# Patient Record
Sex: Female | Born: 1990
Health system: Southern US, Community
[De-identification: ages and names within clinical notes are randomized; demographics above are authoritative.]

## PROBLEM LIST (undated history)

## (undated) DIAGNOSIS — F449 Dissociative and conversion disorder, unspecified: Secondary | ICD-10-CM

## (undated) DIAGNOSIS — F319 Bipolar disorder, unspecified: Secondary | ICD-10-CM

## (undated) DIAGNOSIS — F445 Conversion disorder with seizures or convulsions: Secondary | ICD-10-CM

## (undated) DIAGNOSIS — F4310 Post-traumatic stress disorder, unspecified: Secondary | ICD-10-CM

## (undated) DIAGNOSIS — F32A Depression, unspecified: Secondary | ICD-10-CM

## (undated) DIAGNOSIS — F419 Anxiety disorder, unspecified: Secondary | ICD-10-CM

## (undated) DIAGNOSIS — F329 Major depressive disorder, single episode, unspecified: Secondary | ICD-10-CM

## (undated) HISTORY — DX: Post-traumatic stress disorder, unspecified: F43.10

## (undated) HISTORY — DX: Dissociative and conversion disorder, unspecified: F44.9

## (undated) HISTORY — DX: Bipolar disorder, unspecified: F31.9

## (undated) HISTORY — PX: DENTAL SURGERY: SHX609

---

## 2016-02-07 DIAGNOSIS — F332 Major depressive disorder, recurrent severe without psychotic features: Secondary | ICD-10-CM | POA: Insufficient documentation

## 2016-02-07 DIAGNOSIS — F509 Eating disorder, unspecified: Secondary | ICD-10-CM | POA: Insufficient documentation

## 2016-02-18 DIAGNOSIS — F445 Conversion disorder with seizures or convulsions: Secondary | ICD-10-CM | POA: Insufficient documentation

## 2016-03-14 DIAGNOSIS — F649 Gender identity disorder, unspecified: Secondary | ICD-10-CM | POA: Insufficient documentation

## 2018-01-25 ENCOUNTER — Encounter: Payer: Self-pay | Admitting: Emergency Medicine

## 2018-01-25 ENCOUNTER — Other Ambulatory Visit: Payer: Self-pay

## 2018-01-25 ENCOUNTER — Emergency Department
Admission: EM | Admit: 2018-01-25 | Discharge: 2018-01-25 | Disposition: A | Discharge status: Home / Self Care | Payer: Medicaid Other | Attending: Emergency Medicine | Admitting: Emergency Medicine

## 2018-01-25 DIAGNOSIS — F419 Anxiety disorder, unspecified: Secondary | ICD-10-CM | POA: Diagnosis not present

## 2018-01-25 DIAGNOSIS — F445 Conversion disorder with seizures or convulsions: Secondary | ICD-10-CM | POA: Insufficient documentation

## 2018-01-25 DIAGNOSIS — Z760 Encounter for issue of repeat prescription: Secondary | ICD-10-CM

## 2018-01-25 DIAGNOSIS — F329 Major depressive disorder, single episode, unspecified: Secondary | ICD-10-CM | POA: Diagnosis not present

## 2018-01-25 HISTORY — DX: Conversion disorder with seizures or convulsions: F44.5

## 2018-01-25 HISTORY — DX: Depression, unspecified: F32.A

## 2018-01-25 HISTORY — DX: Anxiety disorder, unspecified: F41.9

## 2018-01-25 HISTORY — DX: Major depressive disorder, single episode, unspecified: F32.9

## 2018-01-25 MED ORDER — BUPROPION HCL 100 MG PO TABS: 50.0000 mg | ORAL_TABLET | Freq: Every day | ORAL | 0 refills | Status: DC

## 2018-01-25 MED ORDER — QUETIAPINE FUMARATE 100 MG PO TABS: 100.0000 mg | ORAL_TABLET | Freq: Every day | ORAL | 0 refills | Status: DC

## 2018-01-25 MED ORDER — SERTRALINE HCL 100 MG PO TABS: 100.0000 mg | ORAL_TABLET | Freq: Every day | ORAL | 0 refills | Status: DC

## 2018-01-25 MED ORDER — TRAZODONE HCL 100 MG PO TABS: 100.0000 mg | ORAL_TABLET | Freq: Every day | ORAL | 0 refills | Status: DC

## 2018-01-25 NOTE — ED Provider Notes (Signed)
[facility] Emergency Department Provider Note ___________________________________________   First MD Initiated Contact with Patient 01/25/18 1731     (approximate)  I have reviewed the triage vital signs and the nursing notes.   HISTORY  Chief Complaint Medication Refill  HPI Diana Meyers is a 27 y.o. female with a history of anxiety, depression and psychogenic seizures was presenting to the emergency department today requesting a medication refill.  The patient says that she has had some small "breakthrough type seizures" ever since being off the medication several weeks ago.  Also reports insomnia.  Says that she is just recently moved from [place] and is trying to establish medical care here in [place].   Past Medical History:  Diagnosis Date  . Anxiety   . Depression   . Psychogenic nonepileptic seizure     There are no active problems to display for this patient.   Past Surgical History:  Procedure Laterality Date  . DENTAL SURGERY      Prior to Admission medications   Not on File    Allergies Shellfish allergy  No family history on file.  Social History Social History   Tobacco Use  . Smoking status: Never Smoker  . Smokeless tobacco: Never Used  Substance Use Topics  . Alcohol use: Not Currently  . Drug use: Yes    Types: Marijuana    Review of Systems  Constitutional: No fever/chills Eyes: No visual changes. ENT: No sore throat. Cardiovascular: Denies chest pain. Respiratory: Denies shortness of breath. Gastrointestinal: No abdominal pain.  No nausea, no vomiting.  No diarrhea.  No constipation. Genitourinary: Negative for dysuria. Musculoskeletal: Negative for back pain. Skin: Negative for rash. Neurological: Negative for headaches, focal weakness or numbness.   ____________________________________________   PHYSICAL EXAM:  VITAL SIGNS: ED Triage Vitals  Enc Vitals Group     BP 01/25/18 1657 (!) 141/91       Pulse Rate 01/25/18 1657 88     Resp 01/25/18 1657 16     Temp 01/25/18 1657 98.2 F (36.8 C)     Temp Source 01/25/18 1657 Oral     SpO2 01/25/18 1657 97 %     Weight 01/25/18 1658 (!) 315 lb (142.9 kg)     Height 01/25/18 1658  (1.905 m)     Head Circumference --      Peak Flow --      Pain Score 01/25/18 1658 4     Pain Loc --      Pain Edu? --      Excl. in GC? --     Constitutional: Alert and oriented. Well appearing and in no acute distress. Eyes: Conjunctivae are normal.  Head: Atraumatic. Nose: No congestion/rhinnorhea. Mouth/Throat: Mucous membranes are moist.  Neck: No stridor.   Cardiovascular: Normal rate, regular rhythm. Grossly normal heart sounds.  Respiratory: Normal respiratory effort.  No retractions. Lungs CTAB. Gastrointestinal: Soft and nontender. No distention.  Musculoskeletal: No lower extremity tenderness nor edema.  No joint effusions. Neurologic:  Normal speech and language. No gross focal neurologic deficits are appreciated.  No seizure activity.  No tremulousness. Skin:  Skin is warm, dry and intact. No rash noted. Psychiatric: Mood and affect are normal. Speech and behavior are normal.  ____________________________________________   LABS (all labs ordered are listed, but only abnormal results are displayed)  Labs Reviewed - No data to display ____________________________________________  EKG   ____________________________________________  RADIOLOGY   ____________________________________________   PROCEDURES  Procedure(s)  performed:   Procedures  Critical Care performed:   ____________________________________________   INITIAL IMPRESSION / ASSESSMENT AND PLAN / ED COURSE  Pertinent labs & imaging results that were available during my care of the patient were reviewed by me and considered in my medical decision making (see chart for details).  DDX: Medication refill, medication withdrawal As part of my medical  decision making, I reviewed the following data within the electronic MEDICAL RECORD NUMBERReviewed notes from previous primary care provider in [place].  Patient's medications to be refilled.  Patient has referral information from [facility] where she was previous to this emergency department. ____________________________________ ________   FINAL CLINICAL IMPRESSION(S) / ED DIAGNOSES  Medication refill.    NEW MEDICATIONS STARTED DURING THIS VISIT:  New Prescriptions   No medications on file     Note:  This document was prepared using Dragon voice recognition software and may include unintentional dictation errors.     Myrna Blazer, MD 01/25/18 [phone]

## 2018-01-25 NOTE — ED Notes (Signed)
Pt refused vitals prior to discharge.

## 2018-01-25 NOTE — ED Triage Notes (Signed)
Patient is out of meds for 3 weeks.  Moved here 1 month ago and does not have a doctor yet.  seroquel 100 mg daily, certaline 100 mg daily, welbutrin 50 mg daily, trazedone 100 mg daily.

## 2018-01-25 NOTE — ED Notes (Signed)
Pt is out of his psych meds.  Moved here and just got insurance.  Walk in would not fill and [facility] told him he needed to see therapist twice before can get.  Pt reports not sleeping much since being off meds.

## 2018-02-22 ENCOUNTER — Ambulatory Visit: Payer: Medicaid Other | Admitting: Family Medicine

## 2018-02-22 ENCOUNTER — Encounter: Payer: Self-pay | Admitting: Family Medicine

## 2018-02-22 VITALS — BP 120/80 | HR 88 | Ht 75.0 in | Wt 355.0 lb

## 2018-02-22 DIAGNOSIS — F331 Major depressive disorder, recurrent, moderate: Secondary | ICD-10-CM

## 2018-02-22 DIAGNOSIS — F5081 Binge eating disorder: Secondary | ICD-10-CM | POA: Diagnosis not present

## 2018-02-22 DIAGNOSIS — Z7689 Persons encountering health services in other specified circumstances: Secondary | ICD-10-CM

## 2018-02-22 DIAGNOSIS — F19982 Other psychoactive substance use, unspecified with psychoactive substance-induced sleep disorder: Secondary | ICD-10-CM

## 2018-02-22 DIAGNOSIS — F419 Anxiety disorder, unspecified: Secondary | ICD-10-CM | POA: Diagnosis not present

## 2018-02-22 DIAGNOSIS — F4310 Post-traumatic stress disorder, unspecified: Secondary | ICD-10-CM | POA: Diagnosis not present

## 2018-02-22 DIAGNOSIS — F649 Gender identity disorder, unspecified: Secondary | ICD-10-CM

## 2018-02-22 DIAGNOSIS — F445 Conversion disorder with seizures or convulsions: Secondary | ICD-10-CM

## 2018-02-22 MED ORDER — SERTRALINE HCL 100 MG PO TABS: 100.0000 mg | ORAL_TABLET | Freq: Every day | ORAL | 1 refills | Status: DC

## 2018-02-22 MED ORDER — QUETIAPINE FUMARATE 100 MG PO TABS: 100.0000 mg | ORAL_TABLET | Freq: Every day | ORAL | 1 refills | Status: DC

## 2018-02-22 MED ORDER — LISDEXAMFETAMINE DIMESYLATE 50 MG PO CAPS: 50.0000 mg | ORAL_CAPSULE | Freq: Every day | ORAL | 0 refills | Status: DC

## 2018-02-22 MED ORDER — SPIRONOLACTONE 50 MG PO TABS: 100.0000 mg | ORAL_TABLET | Freq: Two times a day (BID) | ORAL | 1 refills | Status: DC

## 2018-02-22 MED ORDER — ESTRADIOL 2 MG PO TABS: 4.0000 mg | ORAL_TABLET | Freq: Two times a day (BID) | ORAL | 1 refills | Status: DC

## 2018-02-22 MED ORDER — TRAZODONE HCL 100 MG PO TABS: 100.0000 mg | ORAL_TABLET | Freq: Every day | ORAL | 1 refills | Status: AC

## 2018-02-22 MED ORDER — BUPROPION HCL 100 MG PO TABS: 50.0000 mg | ORAL_TABLET | Freq: Every day | ORAL | 1 refills | Status: DC

## 2018-02-22 NOTE — Progress Notes (Signed)
Name: Diana Meyers   MRN: [id number]    DOB: Nov 26, 1990   Date:02/22/2018       Progress Note  Subjective  Chief Complaint  Chief Complaint  Patient presents with  . Establish Care    new to area  . Psychiatric Evaluation    has upcoming appt first of July with [facility]  . hormone replacement therapy    been out for "almost a year"    Patient is a 27 year old female who presents for a establishment of care. . The patient reports the following problems: anxiety,PTSD,Depression, and psychogenic nonepileptic seizure.[facility] maintenance has been reviewed Reviewed notes from [facility]. Patient has appt with [facility].Need to reinitiate gender affirmation hormone therapy.   No problem-specific Assessment & Plan notes found for this encounter.   Past Medical History:  Diagnosis Date  . Anxiety   . Bipolar 1 disorder (HCC)   . Depression   . Disassociation disorder   . Psychogenic nonepileptic seizure   . PTSD (post-traumatic stress disorder)     Past Surgical History:  Procedure Laterality Date  . DENTAL SURGERY      History reviewed. No pertinent family history.  Social History   Socioeconomic History  . Marital status: Single    Spouse name: Not on file  . Number of children: Not on file  . Years of education: Not on file  . Highest education level: Not on file  Occupational History  . Not on file  Social Needs  . Financial resource strain: Not on file  . Food insecurity:    Worry: Not on file    Inability: Not on file  . Transportation needs:    Medical: Not on file    Non-medical: Not on file  Tobacco Use  . Smoking status: Never Smoker  . Smokeless tobacco: Never Used  Substance and Sexual Activity  . Alcohol use: Not Currently  . Drug use: Yes    Types: Marijuana  . Sexual activity: Not on file  Lifestyle  . Physical activity:    Days per week: Not on file    Minutes per session: Not on file  . Stress: Not on file  Relationships  . Social connections:    Talks on  phone: Not on file    Gets together: Not on file    Attends religious service: Not on file    Active member of club or organization: Not on file    Attends meetings of clubs or organizations: Not on file    Relationship status: Not on file  . Intimate partner violence:    Fear of current or ex partner: Not on file    Emotionally abused: Not on file    Physically abused: Not on file    Forced sexual activity: Not on file  Other Topics Concern  . Not on file  Social History Narrative  . Not on file    Allergies  Allergen Reactions  . Shellfish Allergy Other (See Comments)    Genetic disorder.  Also seafood allergy    Outpatient Medications Prior to Visit  Medication Sig Dispense Refill  . buPROPion (WELLBUTRIN) 100 MG tablet Take 0.5 tablets (50 mg total) by mouth daily. 30 tablet 0  . estradiol (ESTRACE) 2 MG tablet Take 2 tablets by mouth 2 (two) times daily.    Marland Kitchen lisdexamfetamine (VYVANSE) 50 MG capsule Take 50 mg by mouth daily.    . QUEtiapine (SEROQUEL) 100 MG tablet Take 1 tablet (100 mg total) by  mouth at bedtime. 30 tablet 0  . sertraline (ZOLOFT) 100 MG tablet Take 1 tablet (100 mg total) by mouth daily. 30 tablet 0  . spironolactone (ALDACTONE) 50 MG tablet Take 2 tablets by mouth 2 (two) times daily.    . traZODone (DESYREL) 100 MG tablet Take 1 tablet (100 mg total) by mouth at bedtime. 30 tablet 0   No facility-administered medications prior to visit.     Review of Systems  Constitutional: Negative for chills, fever, malaise/fatigue and weight loss.  HENT: Negative for ear discharge, ear pain and sore throat.   Eyes: Negative for blurred vision.  Respiratory: Negative for cough, sputum production, shortness of breath and wheezing.   Cardiovascular: Negative for chest pain, palpitations and leg swelling.  Gastrointestinal: Negative for abdominal pain, blood in stool, constipation, diarrhea, heartburn, melena and nausea.  Genitourinary: Negative for dysuria,  frequency, hematuria and urgency.  Musculoskeletal: Negative for back pain, joint pain, myalgias and neck pain.  Skin: Negative for rash.  Neurological: Negative for dizziness, tingling, sensory change, focal weakness and headaches.  Endo/Heme/Allergies: Negative for environmental allergies and polydipsia. Does not bruise/bleed easily.  Psychiatric/Behavioral: Negative for depression and suicidal ideas. The patient is not nervous/anxious and does not have insomnia.      Objective  Vitals:   02/22/18 1446  BP: 120/80  Pulse: 88  Weight: (!) 355 lb (161 kg)  Height: 6\' 3"  (1.905 m)    Physical Exam  Constitutional: She is oriented to person, place, and time. She appears well-developed and well-nourished.  HENT:  Head: Normocephalic.  Right Ear: External ear normal.  Left Ear: External ear normal.  Mouth/Throat: Oropharynx is clear and moist.  Eyes: Pupils are equal, round, and reactive to light. Conjunctivae and EOM are normal. Lids are everted and swept, no foreign bodies found. Left eye exhibits no hordeolum. No foreign body present in the left eye. Right conjunctiva is not injected. Left conjunctiva is not injected. No scleral icterus.  Neck: Normal range of motion. Neck supple. No JVD present. No tracheal deviation present. No thyromegaly present.  Cardiovascular: Normal rate, regular rhythm, normal heart sounds and intact distal pulses. Exam reveals no gallop and no friction rub.  No murmur heard. Pulmonary/Chest: Effort normal and breath sounds normal. No respiratory distress. She has no wheezes. She has no rales.  Abdominal: Soft. Bowel sounds are normal. She exhibits no mass. There is no hepatosplenomegaly. There is no tenderness. There is no rebound and no guarding.  Musculoskeletal: Normal range of motion. She exhibits no edema or tenderness.  Lymphadenopathy:    She has no cervical adenopathy.  Neurological: She is alert and oriented to person, place, and time. She has  normal strength. She displays normal reflexes. No cranial nerve deficit.  Skin: Skin is warm. No rash noted.  Psychiatric: She has a normal mood and affect. Her mood appears not anxious. She does not exhibit a depressed mood.  Nursing note and vitals reviewed.     Assessment & Plan  Problem List Items Addressed This Visit      Other   Eating disorder   Relevant Medications   lisdexamfetamine (VYVANSE) 50 MG capsule   Gender dysphoria   Relevant Medications   spironolactone (ALDACTONE) 50 MG tablet   estradiol (ESTRACE) 2 MG tablet   Psychogenic nonepileptic seizure   Drug-induced insomnia (HCC)   Relevant Medications   traZODone (DESYREL) 100 MG tablet   QUEtiapine (SEROQUEL) 100 MG tablet   PTSD (post-traumatic stress disorder)  Relevant Medications   traZODone (DESYREL) 100 MG tablet   sertraline (ZOLOFT) 100 MG tablet   buPROPion (WELLBUTRIN) 100 MG tablet   Anxiety   Relevant Medications   traZODone (DESYREL) 100 MG tablet   sertraline (ZOLOFT) 100 MG tablet   buPROPion (WELLBUTRIN) 100 MG tablet    Other Visit Diagnoses    Establishing care with new doctor, encounter for    -  Primary   Moderate episode of recurrent major depressive disorder (HCC)       scheduled to see psychiatry in July- evaluate and treat   Relevant Medications   traZODone (DESYREL) 100 MG tablet   sertraline (ZOLOFT) 100 MG tablet   buPROPion (WELLBUTRIN) 100 MG tablet      Meds ordered this encounter  Medications  . traZODone (DESYREL) 100 MG tablet    Sig: Take 1 tablet (100 mg total) by mouth at bedtime.    Dispense:  30 tablet    Refill:  1  . sertraline (ZOLOFT) 100 MG tablet    Sig: Take 1 tablet (100 mg total) by mouth daily.    Dispense:  30 tablet    Refill:  1  . QUEtiapine (SEROQUEL) 100 MG tablet    Sig: Take 1 tablet (100 mg total) by mouth at bedtime.    Dispense:  30 tablet    Refill:  1  . spironolactone (ALDACTONE) 50 MG tablet    Sig: Take 2 tablets (100 mg  total) by mouth 2 (two) times daily.    Dispense:  120 tablet    Refill:  1  . estradiol (ESTRACE) 2 MG tablet    Sig: Take 2 tablets (4 mg total) by mouth 2 (two) times daily.    Dispense:  120 tablet    Refill:  1  . lisdexamfetamine (VYVANSE) 50 MG capsule    Sig: Take 1 capsule (50 mg total) by mouth daily.    Dispense:  30 capsule    Refill:  0  . buPROPion (WELLBUTRIN) 100 MG tablet    Sig: Take 0.5 tablets (50 mg total) by mouth daily.    Dispense:  30 tablet    Refill:  1      Dr. Hayden Rasmusseneanna Anni Hocevar [facility]  02/22/18

## 2018-04-05 ENCOUNTER — Ambulatory Visit: Payer: Medicaid Other | Admitting: Family Medicine

## 2018-04-22 ENCOUNTER — Ambulatory Visit (INDEPENDENT_AMBULATORY_CARE_PROVIDER_SITE_OTHER): Payer: Medicare Other | Admitting: Family Medicine

## 2018-04-22 ENCOUNTER — Encounter: Payer: Self-pay | Admitting: Family Medicine

## 2018-04-22 VITALS — BP 102/70 | HR 64 | Ht 75.0 in | Wt 346.0 lb

## 2018-04-22 DIAGNOSIS — F649 Gender identity disorder, unspecified: Secondary | ICD-10-CM

## 2018-04-22 MED ORDER — ESTRADIOL 2 MG PO TABS: 2.0000 mg | ORAL_TABLET | Freq: Three times a day (TID) | ORAL | 5 refills | Status: DC

## 2018-04-22 MED ORDER — SPIRONOLACTONE 50 MG PO TABS: 100.0000 mg | ORAL_TABLET | Freq: Two times a day (BID) | ORAL | 5 refills | Status: DC

## 2018-04-22 NOTE — Progress Notes (Signed)
Name: Diana Meyers   MRN: [id number]    DOB: 02/19/1991   Date:04/22/2018       Progress Note  Subjective  Chief Complaint  Chief Complaint  Patient presents with  . Follow-up    psychiatrist won't prescribe vyvanse and seroquel/ increased trazodone- not helping with sleep.     Patient follow up for cross hormone therapy. Presently not taking spironolactone and estradiol.   No problem-specific Assessment & Plan notes found for this encounter.   Past Medical History:  Diagnosis Date  . Anxiety   . Bipolar 1 disorder (HCC)   . Depression   . Disassociation disorder   . Psychogenic nonepileptic seizure   . PTSD (post-traumatic stress disorder)     Past Surgical History:  Procedure Laterality Date  . DENTAL SURGERY      History reviewed. No pertinent family history.  Social History   Socioeconomic History  . Marital status: Single    Spouse name: Not on file  . Number of children: Not on file  . Years of education: Not on file  . Highest education level: Not on file  Occupational History  . Not on file  Social Needs  . Financial resource strain: Not on file  . Food insecurity:    Worry: Not on file    Inability: Not on file  . Transportation needs:    Medical: Not on file    Non-medical: Not on file  Tobacco Use  . Smoking status: Never Smoker  . Smokeless tobacco: Never Used  Substance and Sexual Activity  . Alcohol use: Not Currently  . Drug use: Yes    Types: Marijuana  . Sexual activity: Not on file  Lifestyle  . Physical activity:    Days per week: Not on file    Minutes per session: Not on file  . Stress: Not on file  Relationships  . Social connections:    Talks on phone: Not on file    Gets together: Not on file    Attends religious service: Not on file    Active member of club or organization: Not on file    Attends meetings of clubs or organizations: Not on file    Relationship status: Not on file  . Intimate partner violence:    Fear of  current or ex partner: Not on file    Emotionally abused: Not on file    Physically abused: Not on file    Forced sexual activity: Not on file  Other Topics Concern  . Not on file  Social History Narrative  . Not on file    Allergies  Allergen Reactions  . Shellfish Allergy Other (See Comments)    Genetic disorder.  Also seafood allergy    Outpatient Medications Prior to Visit  Medication Sig Dispense Refill  . buPROPion (WELLBUTRIN) 100 MG tablet Take 0.5 tablets (50 mg total) by mouth daily. 30 tablet 1  . lisdexamfetamine (VYVANSE) 50 MG capsule Take 1 capsule (50 mg total) by mouth daily. 30 capsule 0  . QUEtiapine (SEROQUEL) 100 MG tablet Take 1 tablet (100 mg total) by mouth at bedtime. 30 tablet 1  . sertraline (ZOLOFT) 100 MG tablet Take 1 tablet (100 mg total) by mouth daily. 30 tablet 1  . traZODone (DESYREL) 100 MG tablet Take 1 tablet (100 mg total) by mouth at bedtime. (Patient taking differently: Take 200 mg by mouth at bedtime. ) 30 tablet 1  . estradiol (ESTRACE) 2 MG tablet Take 2 tablets (4  mg total) by mouth 2 (two) times daily. 120 tablet 1  . spironolactone (ALDACTONE) 50 MG tablet Take 2 tablets (100 mg total) by mouth 2 (two) times daily. 120 tablet 1   No facility-administered medications prior to visit.     Review of Systems  Constitutional: Negative for chills, fever, malaise/fatigue and weight loss.  HENT: Negative for ear discharge, ear pain and sore throat.   Eyes: Negative for blurred vision.  Respiratory: Negative for cough, sputum production, shortness of breath and wheezing.   Cardiovascular: Negative for chest pain, palpitations and leg swelling.  Gastrointestinal: Negative for abdominal pain, blood in stool, constipation, diarrhea, heartburn, melena and nausea.  Genitourinary: Negative for dysuria, frequency, hematuria and urgency.  Musculoskeletal: Negative for back pain, joint pain, myalgias and neck pain.  Skin: Negative for rash.   Neurological: Negative for dizziness, tingling, sensory change, focal weakness and headaches.  Endo/Heme/Allergies: Negative for environmental allergies and polydipsia. Does not bruise/bleed easily.  Psychiatric/Behavioral: Negative for depression and suicidal ideas. The patient is not nervous/anxious and does not have insomnia.      Objective  Vitals:   04/22/18 1425  BP: 102/70  Pulse: 64  Weight: (!) 346 lb (156.9 kg)  Height: 6\' 3"  (1.905 m)    Physical Exam  Constitutional: She is oriented to person, place, and time. She appears well-developed and well-nourished. No distress.  HENT:  Head: Normocephalic and atraumatic.  Right Ear: External ear normal.  Left Ear: External ear normal.  Nose: Nose normal.  Mouth/Throat: Oropharynx is clear and moist.  Eyes: Pupils are equal, round, and reactive to light. Conjunctivae and EOM are normal. Lids are everted and swept, no foreign bodies found. Right eye exhibits no discharge. Left eye exhibits no discharge and no hordeolum. No foreign body present in the left eye. Right conjunctiva is not injected. Left conjunctiva is not injected. No scleral icterus.  Neck: Normal range of motion. Neck supple. No JVD present. No tracheal deviation present. No thyromegaly present.  Cardiovascular: Normal rate, regular rhythm, normal heart sounds and intact distal pulses. Exam reveals no gallop and no friction rub.  No murmur heard. Pulmonary/Chest: Effort normal and breath sounds normal. No respiratory distress. She has no wheezes. She has no rales.  Abdominal: Soft. Bowel sounds are normal. She exhibits no mass. There is no hepatosplenomegaly. There is no tenderness. There is no rebound and no guarding.  Musculoskeletal: Normal range of motion. She exhibits no edema or tenderness.  Lymphadenopathy:    She has no cervical adenopathy.  Neurological: She is alert and oriented to person, place, and time. She has normal strength and normal reflexes. She  displays normal reflexes. No cranial nerve deficit.  Skin: Skin is warm and dry. No rash noted. She is not diaphoretic.  Psychiatric: She has a normal mood and affect. Her mood appears not anxious. She does not exhibit a depressed mood.      Assessment & Plan  Problem List Items Addressed This Visit      Other   Gender dysphoria - Primary   Relevant Medications   spironolactone (ALDACTONE) 50 MG tablet   estradiol (ESTRACE) 2 MG tablet      Meds ordered this encounter  Medications  . spironolactone (ALDACTONE) 50 MG tablet    Sig: Take 2 tablets (100 mg total) by mouth 2 (two) times daily.    Dispense:  120 tablet    Refill:  5  . estradiol (ESTRACE) 2 MG tablet    Sig: Take  1 tablet (2 mg total) by mouth 3 (three) times daily.    Dispense:  90 tablet    Refill:  5      Dr. Elizabeth Sauer [facility]  04/22/18

## 2018-04-23 ENCOUNTER — Telehealth: Payer: Self-pay

## 2018-04-23 NOTE — Telephone Encounter (Signed)
Diana Meyers returned call from yesterday. The patient has d/c'd her seroquel "on her own" and has "never brought up Vyvanse to Dr. Suzie PortelaMoffitt" they didn't know she was taking Vyvanse. Trazodone increased to 200mg  a day, but they didn't know she was taking Vyvanse and still on Seroquel. I tried to call pt to tell her to give Dr Rhunette CroftMoffitt's office a call and there was no answer or no way to leave a message. Diana Meyers said she can feel free to call their office with any medication concerns

## 2018-04-24 ENCOUNTER — Other Ambulatory Visit: Payer: Self-pay | Admitting: Family Medicine

## 2018-04-24 DIAGNOSIS — F19982 Other psychoactive substance use, unspecified with psychoactive substance-induced sleep disorder: Secondary | ICD-10-CM

## 2018-04-27 DIAGNOSIS — F331 Major depressive disorder, recurrent, moderate: Secondary | ICD-10-CM | POA: Diagnosis not present

## 2018-05-25 DIAGNOSIS — F331 Major depressive disorder, recurrent, moderate: Secondary | ICD-10-CM | POA: Diagnosis not present

## 2018-08-30 DIAGNOSIS — F331 Major depressive disorder, recurrent, moderate: Secondary | ICD-10-CM | POA: Diagnosis not present

## 2018-09-28 DIAGNOSIS — F332 Major depressive disorder, recurrent severe without psychotic features: Secondary | ICD-10-CM | POA: Diagnosis not present

## 2018-10-25 ENCOUNTER — Ambulatory Visit (INDEPENDENT_AMBULATORY_CARE_PROVIDER_SITE_OTHER): Payer: Medicare Other | Admitting: Family Medicine

## 2018-10-25 ENCOUNTER — Encounter: Payer: Self-pay | Admitting: Family Medicine

## 2018-10-25 VITALS — BP 120/68 | HR 80 | Ht 75.0 in | Wt 371.0 lb

## 2018-10-25 DIAGNOSIS — Z6841 Body Mass Index (BMI) 40.0 and over, adult: Secondary | ICD-10-CM | POA: Diagnosis not present

## 2018-10-25 DIAGNOSIS — F649 Gender identity disorder, unspecified: Secondary | ICD-10-CM

## 2018-10-25 MED ORDER — ESTRADIOL 2 MG PO TABS: 2.0000 mg | ORAL_TABLET | Freq: Three times a day (TID) | ORAL | 6 refills | Status: DC

## 2018-10-25 MED ORDER — SPIRONOLACTONE 50 MG PO TABS: 100.0000 mg | ORAL_TABLET | Freq: Two times a day (BID) | ORAL | 6 refills | Status: DC

## 2018-10-25 NOTE — Progress Notes (Signed)
Date:  10/25/2018   Name:  Diana Meyers   DOB:  [birth year]   MRN:  [id number]   Chief Complaint: hormone replacement therapy (refill estradiol and spironolactone/ PHQ9=19- sees Dr Suzie Portela)  Patient is a 28 year old female who presents for a gender affirmation hormone therapy/ exam. The patient reports the following problems: no issue. Health maintenance has been reviewed up to date.   Review of Systems  Constitutional: Negative.  Negative for chills, fatigue, fever and unexpected weight change.  HENT: Negative for congestion, ear discharge, ear pain, rhinorrhea, sinus pressure, sneezing and sore throat.   Eyes: Negative for photophobia, pain, discharge, redness and itching.  Respiratory: Negative for cough, shortness of breath, wheezing and stridor.   Gastrointestinal: Negative for abdominal pain, blood in stool, constipation, diarrhea, nausea and vomiting.  Endocrine: Negative for cold intolerance, heat intolerance, polydipsia, polyphagia and polyuria.  Genitourinary: Negative for dysuria, flank pain, frequency, hematuria, menstrual problem, pelvic pain, urgency, vaginal bleeding and vaginal discharge.  Musculoskeletal: Negative for arthralgias, back pain and myalgias.  Skin: Negative for rash.  Allergic/Immunologic: Negative for environmental allergies and food allergies.  Neurological: Negative for dizziness, weakness, light-headedness, numbness and headaches.  Hematological: Negative for adenopathy. Does not bruise/bleed easily.  Psychiatric/Behavioral: Negative for dysphoric mood. The patient is not nervous/anxious.     Patient Active Problem List   Diagnosis Date Noted  . Drug-induced insomnia (HCC) 02/22/2018  . PTSD (post-traumatic stress disorder) 02/22/2018  . Anxiety 02/22/2018  . Gender dysphoria 03/14/2016  . Psychogenic nonepileptic seizure 02/18/2016  . Eating disorder 02/07/2016  . Severe episode of recurrent major depressive disorder, without psychotic features (HCC)  02/07/2016    Allergies  Allergen Reactions  . Shellfish Allergy Other (See Comments)    Genetic disorder.  Also seafood allergy    Past Surgical History:  Procedure Laterality Date  . DENTAL SURGERY      Social History   Tobacco Use  . Smoking status: Never Smoker  . Smokeless tobacco: Never Used  Substance Use Topics  . Alcohol use: Not Currently  . Drug use: Yes    Types: Marijuana     Medication list has been reviewed and updated.  Current Meds  Medication Sig  . buPROPion (WELLBUTRIN) 100 MG tablet Take 0.5 tablets (50 mg total) by mouth daily. (Patient taking differently: Take 100 mg by mouth daily. psych)  . estradiol (ESTRACE) 2 MG tablet Take 1 tablet (2 mg total) by mouth 3 (three) times daily.  . sertraline (ZOLOFT) 100 MG tablet Take 1 tablet (100 mg total) by mouth daily. (Patient taking differently: Take 100 mg by mouth daily. psych)  . spironolactone (ALDACTONE) 50 MG tablet Take 2 tablets (100 mg total) by mouth 2 (two) times daily.  . traZODone (DESYREL) 100 MG tablet Take 1 tablet (100 mg total) by mouth at bedtime. (Patient taking differently: Take 200 mg by mouth at bedtime. psych)  . zolpidem (AMBIEN) 10 MG tablet Take 10 mg by mouth at bedtime. psych    PHQ 2/9 Scores 10/25/2018 02/22/2018  PHQ - 2 Score 6 5  PHQ- 9 Score 19 19    Physical Exam Vitals signs and nursing note reviewed.  Constitutional:      General: She is not in acute distress.    Appearance: Normal appearance. She is not diaphoretic.  HENT:     Head: Normocephalic and atraumatic.     Right Ear: External ear normal.     Left Ear: External ear normal.  Nose: Nose normal.  Eyes:     General:        Right eye: No discharge.        Left eye: No discharge.     Conjunctiva/sclera: Conjunctivae normal.     Pupils: Pupils are equal, round, and reactive to light.  Neck:     Musculoskeletal: Normal range of motion and neck supple. No muscular tenderness.     Thyroid: No  thyromegaly.     Vascular: No carotid bruit or JVD.  Cardiovascular:     Rate and Rhythm: Normal rate and regular rhythm.     Heart sounds: Normal heart sounds. No murmur. No friction rub. No gallop.   Pulmonary:     Effort: Pulmonary effort is normal.     Breath sounds: Normal breath sounds. No wheezing or rhonchi.  Abdominal:     General: Bowel sounds are normal.     Palpations: Abdomen is soft. There is no mass.     Tenderness: There is no abdominal tenderness. There is no guarding.  Musculoskeletal: Normal range of motion.  Lymphadenopathy:     Cervical: No cervical adenopathy.  Skin:    General: Skin is warm and dry.     Capillary Refill: Capillary refill takes less than 2 seconds.  Neurological:     Mental Status: She is alert.     Deep Tendon Reflexes: Reflexes are normal and symmetric.     BP 120/68   Pulse 80   Ht 6\' 3"  (1.905 m)   Wt (!) 371 lb (168.3 kg)   BMI 46.37 kg/m   Assessment and Plan: 1. BMI 45.0-49.9, adult [facility]) Patient apparently has a diagnosis of an eating disorder for which somebody started her on Vyvanse I have told the patient that I do not prescribe this medication it is control and apparently neither does her psychiatrist and who would prescribe it if it was ADHD but does not prescribe for binge eating disorder.  Patient has been noted to have weight gain I really think it is a matter of her having the ability to say no,  than to depend on the control medicine for the rest of her life to curb her eating.  Probably benefit from some behavioral medicine at in nutrition clinic I will discuss this with my nurse and we will see if we can get her into a situation as such.  2. Gender dysphoria Gender affirmation hormone therapy is to continue with estradiol 6 mg daily and Spironolactone 200 mg daily for testosterone suppression. - estradiol (ESTRACE) 2 MG tablet; Take 1 tablet (2 mg total) by mouth 3 (three) times daily.  Dispense: 90 tablet; Refill: 6 -  spironolactone (ALDACTONE) 50 MG tablet; Take 2 tablets (100 mg total) by mouth 2 (two) times daily.  Dispense: 120 tablet; Refill: 6

## 2018-11-22 DIAGNOSIS — F332 Major depressive disorder, recurrent severe without psychotic features: Secondary | ICD-10-CM | POA: Diagnosis not present

## 2019-03-29 ENCOUNTER — Other Ambulatory Visit: Payer: Self-pay | Admitting: Family Medicine

## 2019-04-04 DIAGNOSIS — F332 Major depressive disorder, recurrent severe without psychotic features: Secondary | ICD-10-CM | POA: Diagnosis not present

## 2019-04-21 ENCOUNTER — Other Ambulatory Visit: Payer: Self-pay

## 2019-04-21 ENCOUNTER — Telehealth: Payer: Self-pay

## 2019-04-21 ENCOUNTER — Ambulatory Visit (INDEPENDENT_AMBULATORY_CARE_PROVIDER_SITE_OTHER): Payer: Medicare Other | Admitting: Family Medicine

## 2019-04-21 ENCOUNTER — Encounter: Payer: Self-pay | Admitting: Family Medicine

## 2019-04-21 VITALS — BP 144/82 | HR 109 | Ht 75.0 in | Wt 381.0 lb

## 2019-04-21 DIAGNOSIS — R030 Elevated blood-pressure reading, without diagnosis of hypertension: Secondary | ICD-10-CM

## 2019-04-21 DIAGNOSIS — R69 Illness, unspecified: Secondary | ICD-10-CM

## 2019-04-21 DIAGNOSIS — F649 Gender identity disorder, unspecified: Secondary | ICD-10-CM

## 2019-04-21 MED ORDER — SPIRONOLACTONE 50 MG PO TABS: 100.0000 mg | ORAL_TABLET | Freq: Two times a day (BID) | ORAL | 6 refills | Status: AC

## 2019-04-21 MED ORDER — ESTRADIOL 2 MG PO TABS: 2.0000 mg | ORAL_TABLET | Freq: Three times a day (TID) | ORAL | 6 refills | Status: AC

## 2019-04-21 NOTE — Patient Instructions (Signed)

## 2019-04-21 NOTE — Progress Notes (Signed)
Date:  04/21/2019   Name:  Diana Meyers   DOB:  1990/10/04   MRN:  [id number]   Chief Complaint: hrt (PHQ9=14- sees Dr Sabino Gasser)  Patient is a 28 year old female who presents for gender affirmation hormone therapy exam. The patient reports the following problems: anxiety. Health maintenance has been reviewed up to date.   Review of Systems  Constitutional: Negative.  Negative for chills, fatigue, fever and unexpected weight change.  HENT: Negative for congestion, ear discharge, ear pain, rhinorrhea, sinus pressure, sneezing and sore throat.   Eyes: Negative for photophobia, pain, discharge, redness and itching.  Respiratory: Negative for cough, choking, chest tightness, shortness of breath, wheezing and stridor.   Cardiovascular: Negative for chest pain, palpitations and leg swelling.  Gastrointestinal: Negative for abdominal pain, blood in stool, constipation, diarrhea, nausea and vomiting.  Endocrine: Negative for cold intolerance, heat intolerance, polydipsia, polyphagia and polyuria.  Genitourinary: Negative for dysuria, flank pain, frequency, hematuria, menstrual problem, pelvic pain, urgency, vaginal bleeding and vaginal discharge.  Musculoskeletal: Negative for arthralgias, back pain and myalgias.  Skin: Negative for rash.  Allergic/Immunologic: Negative for environmental allergies and food allergies.  Neurological: Negative for dizziness, weakness, light-headedness, numbness and headaches.  Hematological: Negative for adenopathy. Does not bruise/bleed easily.  Psychiatric/Behavioral: Negative for dysphoric mood. The patient is not nervous/anxious.     Patient Active Problem List   Diagnosis Date Noted   Drug-induced insomnia ([place]) 02/22/2018   PTSD (post-traumatic stress disorder) 02/22/2018   Anxiety 02/22/2018   Gender dysphoria 03/14/2016   Psychogenic nonepileptic seizure 02/18/2016   Eating disorder 02/07/2016   Severe episode of recurrent major depressive disorder,  without psychotic features ([place]) 02/07/2016    Allergies  Allergen Reactions   Shellfish Allergy Other (See Comments)    Genetic disorder.  Also seafood allergy    Past Surgical History:  Procedure Laterality Date   DENTAL SURGERY      Social History   Tobacco Use   Smoking status: Never Smoker   Smokeless tobacco: Never Used  Substance Use Topics   Alcohol use: Not Currently   Drug use: Yes    Types: Marijuana     Medication list has been reviewed and updated.  Current Meds  Medication Sig   DULoxetine (CYMBALTA) 60 MG capsule Take 60 mg by mouth daily. psych   estradiol (ESTRACE) 2 MG tablet Take 1 tablet (2 mg total) by mouth 3 (three) times daily.   spironolactone (ALDACTONE) 50 MG tablet Take 2 tablets (100 mg total) by mouth 2 (two) times daily.   traZODone (DESYREL) 100 MG tablet Take 1 tablet (100 mg total) by mouth at bedtime. (Patient taking differently: Take 200 mg by mouth at bedtime. psych)   zolpidem (AMBIEN) 10 MG tablet Take 10 mg by mouth at bedtime. psych    PHQ 2/9 Scores 04/21/2019 10/25/2018 02/22/2018  PHQ - 2 Score 5 6 5   PHQ- 9 Score 14 19 19     BP Readings from Last 3 Encounters:  04/21/19 (!) 144/82  10/25/18 120/68  04/22/18 102/70    Physical Exam Vitals signs and nursing note reviewed.  Constitutional:      General: She is not in acute distress.    Appearance: She is obese. She is not diaphoretic.  HENT:     Head: Normocephalic and atraumatic.     Right Ear: Tympanic membrane, ear canal and external ear normal.     Left Ear: Tympanic membrane, ear canal and external ear normal.  Nose: Nose normal.  Eyes:     General:        Right eye: No discharge.        Left eye: No discharge.     Conjunctiva/sclera: Conjunctivae normal.     Pupils: Pupils are equal, round, and reactive to light.  Neck:     Musculoskeletal: Normal range of motion and neck supple.     Thyroid: No thyromegaly.     Vascular: No JVD.    Cardiovascular:     Rate and Rhythm: Normal rate and regular rhythm.     Heart sounds: Normal heart sounds. No murmur. No friction rub. No gallop.   Pulmonary:     Effort: Pulmonary effort is normal.     Breath sounds: Normal breath sounds. No wheezing or rhonchi.  Abdominal:     General: Bowel sounds are normal.     Palpations: Abdomen is soft. There is no mass.     Tenderness: There is no abdominal tenderness. There is no guarding.  Musculoskeletal: Normal range of motion.  Lymphadenopathy:     Cervical: No cervical adenopathy.  Skin:    General: Skin is warm and dry.  Neurological:     Mental Status: She is alert.     Deep Tendon Reflexes: Reflexes are normal and symmetric.     Wt Readings from Last 3 Encounters:  04/21/19 (!) 381 lb (172.8 kg)  10/25/18 (!) 371 lb (168.3 kg)  04/22/18 (!) 346 lb (156.9 kg)    BP (!) 144/82    Pulse (!) 109    Ht 6\' 3"  (1.905 m)    Wt (!) 381 lb (172.8 kg)    SpO2 97%    BMI 47.62 kg/m   Assessment and Plan:  1. Gender dysphoria Patient with history of gender dysphoria we will refill Spironolactone 50 mg 2 tablets by mouth 2 times a day.  And estradiol 2 mg 1 tablet 3 times a day. - spironolactone (ALDACTONE) 50 MG tablet; Take 2 tablets (100 mg total) by mouth 2 (two) times daily.  Dispense: 120 tablet; Refill: 6 - estradiol (ESTRACE) 2 MG tablet; Take 1 tablet (2 mg total) by mouth 3 (three) times daily.  Dispense: 90 tablet; Refill: 6  2. Elevated blood pressure reading in office without diagnosis of hypertension Patient was noted to have elevated blood pressure reading.  I do not feel that we need to start on any medication however patient was given a DASH diet to follow to bring blood pressure down and hopefully to control the weight. - Renal Function Panel  3. Taking medication for chronic disease Patient is on Spironolactone and we will check a renal function panel to evaluate potassium status. - Renal Function Panel

## 2019-04-21 NOTE — Telephone Encounter (Signed)
Put call into [place] concerning pt. To see if we could get pt's appt moved up due to some concerns the pt is having. Dr Ronnald Ramp will only address the HRT needs. Left message on her vm to return my call [phone]

## 2019-04-22 ENCOUNTER — Telehealth: Payer: Self-pay

## 2019-04-22 LAB — RENAL FUNCTION PANEL
Albumin: 4.5 g/dL (ref 3.9–5.0)
BUN/Creatinine Ratio: 9 (ref 9–23)
BUN: 9 mg/dL (ref 6–20)
CO2: 19 mmol/L — ABNORMAL LOW (ref 20–29)
Calcium: 9.8 mg/dL (ref 8.7–10.2)
Chloride: 100 mmol/L (ref 96–106)
Creatinine, Ser: 1.05 mg/dL — ABNORMAL HIGH (ref 0.57–1.00)
GFR calc Af Amer: 83 mL/min/{1.73_m2} (ref >59)
GFR calc non Af Amer: 72 mL/min/{1.73_m2} (ref >59)
Glucose: 185 mg/dL — ABNORMAL HIGH (ref 65–99)
Phosphorus: 2.5 mg/dL — ABNORMAL LOW (ref 3.0–4.3)
Potassium: 4.3 mmol/L (ref 3.5–5.2)
Sodium: 137 mmol/L (ref 134–144)

## 2019-04-22 NOTE — Telephone Encounter (Signed)
Otila Kluver returned my call from psychiatry concerning pt- has stated that pt just saw Dr Sabino Gasser and there was no mention of Vyvanse at visit. "All she needs to do is call and ask for me and I'll discuss this with her and see if we can do something to help her." I called pt to give her this information and she said, "I didn't mention it to Dr Sabino Gasser because I've given up." I explained to her that she should call them because it sounds like they are trying to help.

## 2019-04-26 ENCOUNTER — Telehealth: Payer: Self-pay

## 2019-04-26 NOTE — Telephone Encounter (Signed)
Pt was supposed to come in for labs and we received a call stating that she has "had to leave the state"- pt sounded agitated or nervous. I said thanks for calling and please return for labs. She agreed

## 2019-05-24 ENCOUNTER — Other Ambulatory Visit: Payer: Medicare Other

## 2019-05-24 DIAGNOSIS — R739 Hyperglycemia, unspecified: Secondary | ICD-10-CM

## 2019-05-25 LAB — HEMOGLOBIN A1C
Est. average glucose Bld gHb Est-mCnc: 137 mg/dL
Hgb A1c MFr Bld: 6.4 % — ABNORMAL HIGH (ref 4.8–5.6)

## 2019-05-25 LAB — LIPID PANEL WITH LDL/HDL RATIO
Cholesterol, Total: 249 mg/dL — ABNORMAL HIGH (ref 100–199)
HDL: 34 mg/dL — ABNORMAL LOW (ref >39)
LDL Chol Calc (NIH): 177 mg/dL — ABNORMAL HIGH (ref 0–99)
LDL/HDL Ratio: 5.2 ratio — ABNORMAL HIGH (ref 0.0–3.2)
Triglycerides: 202 mg/dL — ABNORMAL HIGH (ref 0–149)
VLDL Cholesterol Cal: 38 mg/dL (ref 5–40)

## 2019-05-26 ENCOUNTER — Other Ambulatory Visit: Payer: Self-pay

## 2019-05-26 DIAGNOSIS — E119 Type 2 diabetes mellitus without complications: Secondary | ICD-10-CM

## 2019-05-26 DIAGNOSIS — E782 Mixed hyperlipidemia: Secondary | ICD-10-CM

## 2019-05-26 MED ORDER — ATORVASTATIN CALCIUM 10 MG PO TABS: 10.0000 mg | ORAL_TABLET | Freq: Every day | ORAL | 1 refills | Status: DC

## 2019-05-26 MED ORDER — METFORMIN HCL 500 MG PO TABS: 500.0000 mg | ORAL_TABLET | Freq: Every day | ORAL | 1 refills | Status: DC

## 2019-05-26 NOTE — Progress Notes (Unsigned)
Sent in lipitor and met

## 2019-05-28 ENCOUNTER — Emergency Department
Admission: EM | Admit: 2019-05-28 | Discharge: 2019-05-28 | Disposition: A | Discharge status: Home / Self Care | Payer: Medicare Other | Attending: Student | Admitting: Student

## 2019-05-28 ENCOUNTER — Emergency Department: Payer: Medicare Other

## 2019-05-28 ENCOUNTER — Other Ambulatory Visit: Payer: Self-pay

## 2019-05-28 DIAGNOSIS — Z79899 Other long term (current) drug therapy: Secondary | ICD-10-CM | POA: Diagnosis not present

## 2019-05-28 DIAGNOSIS — Z7984 Long term (current) use of oral hypoglycemic drugs: Secondary | ICD-10-CM | POA: Insufficient documentation

## 2019-05-28 DIAGNOSIS — R112 Nausea with vomiting, unspecified: Secondary | ICD-10-CM

## 2019-05-28 DIAGNOSIS — R1110 Vomiting, unspecified: Secondary | ICD-10-CM | POA: Diagnosis not present

## 2019-05-28 DIAGNOSIS — R10815 Periumbilic abdominal tenderness: Secondary | ICD-10-CM | POA: Insufficient documentation

## 2019-05-28 LAB — URINALYSIS, COMPLETE (UACMP) WITH MICROSCOPIC
Bilirubin Urine: NEGATIVE
Glucose, UA: NEGATIVE mg/dL
Hgb urine dipstick: NEGATIVE
Ketones, ur: 5 mg/dL — AB
Nitrite: NEGATIVE
Protein, ur: NEGATIVE mg/dL
Specific Gravity, Urine: 1.02 (ref 1.005–1.030)
pH: 5 (ref 5.0–8.0)

## 2019-05-28 LAB — URINE DRUG SCREEN, QUALITATIVE (ARMC ONLY)
Amphetamines, Ur Screen: NOT DETECTED
Barbiturates, Ur Screen: NOT DETECTED
Benzodiazepine, Ur Scrn: NOT DETECTED
Cannabinoid 50 Ng, Ur ~~LOC~~: POSITIVE — AB
Cocaine Metabolite,Ur ~~LOC~~: NOT DETECTED
MDMA (Ecstasy)Ur Screen: NOT DETECTED
Methadone Scn, Ur: NOT DETECTED
Opiate, Ur Screen: NOT DETECTED
Phencyclidine (PCP) Ur S: NOT DETECTED
Tricyclic, Ur Screen: NOT DETECTED

## 2019-05-28 LAB — COMPREHENSIVE METABOLIC PANEL
ALT: 36 U/L (ref 0–44)
AST: 23 U/L (ref 15–41)
Albumin: 4.2 g/dL (ref 3.5–5.0)
Alkaline Phosphatase: 107 U/L (ref 38–126)
Anion gap: 11 (ref 5–15)
BUN: 11 mg/dL (ref 6–20)
CO2: 21 mmol/L — ABNORMAL LOW (ref 22–32)
Calcium: 9.1 mg/dL (ref 8.9–10.3)
Chloride: 105 mmol/L (ref 98–111)
Creatinine, Ser: 0.99 mg/dL (ref 0.44–1.00)
GFR calc Af Amer: >60 mL/min (ref >60)
GFR calc non Af Amer: >60 mL/min (ref >60)
Glucose, Bld: 140 mg/dL — ABNORMAL HIGH (ref 70–99)
Potassium: 4 mmol/L (ref 3.5–5.1)
Sodium: 137 mmol/L (ref 135–145)
Total Bilirubin: 1.1 mg/dL (ref 0.3–1.2)
Total Protein: 8.3 g/dL — ABNORMAL HIGH (ref 6.5–8.1)

## 2019-05-28 LAB — CBC
HCT: 43.4 % (ref 36.0–46.0)
Hemoglobin: 14.2 g/dL (ref 12.0–15.0)
MCH: 27.5 pg (ref 26.0–34.0)
MCHC: 32.7 g/dL (ref 30.0–36.0)
MCV: 83.9 fL (ref 80.0–100.0)
Platelets: 565 10*3/uL — ABNORMAL HIGH (ref 150–400)
RBC: 5.17 MIL/uL — ABNORMAL HIGH (ref 3.87–5.11)
RDW: 12.6 % (ref 11.5–15.5)
WBC: 20.6 10*3/uL — ABNORMAL HIGH (ref 4.0–10.5)
nRBC: 0 % (ref 0.0–0.2)

## 2019-05-28 LAB — LIPASE, BLOOD: Lipase: 26 U/L (ref 11–51)

## 2019-05-28 LAB — POCT PREGNANCY, URINE: Preg Test, Ur: NEGATIVE

## 2019-05-28 IMAGING — CT CT ABD-PELV W/ CM
2 of 4 series · 16 of 46 positions shown, 18 images · IV contrast (APPLIED)
Comparison: None.

CLINICAL DATA: Vomiting for multiple days.

EXAM:
CT ABDOMEN AND PELVIS WITH CONTRAST
TECHNIQUE: Multidetector CT imaging of the abdomen and pelvis was performed
using the standard protocol following bolus administration of
intravenous contrast.
CONTRAST:  100mL OMNIPAQUE IOHEXOL 350 MG/ML SOLN

[Series 2: routine abd/pel with · axial · 0.98mm/px · z∈[-783,-263]mm · 13 of 114 slices shown, 15 images]
[im 5/114  soft-tissue]
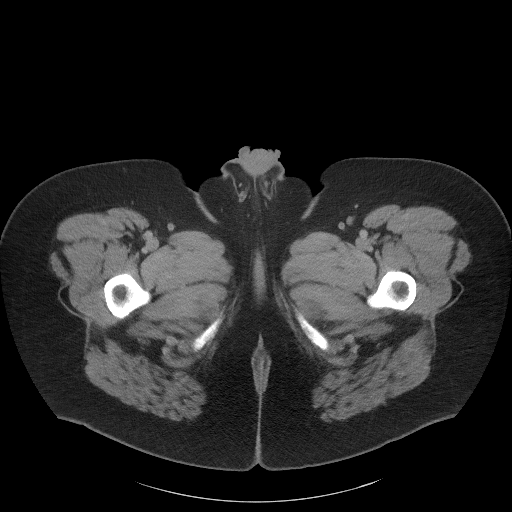
[im 5/114  bone]
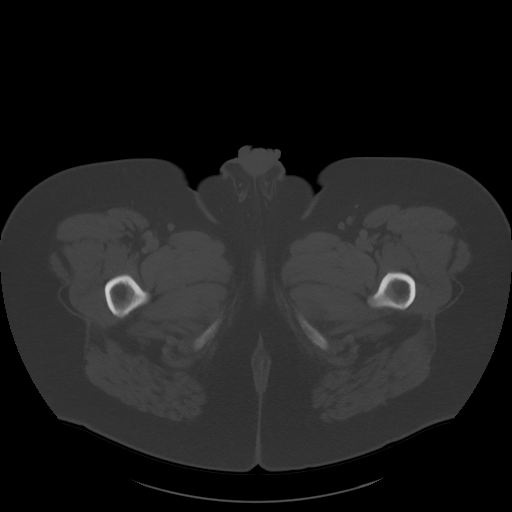
[im 15/114  soft-tissue]
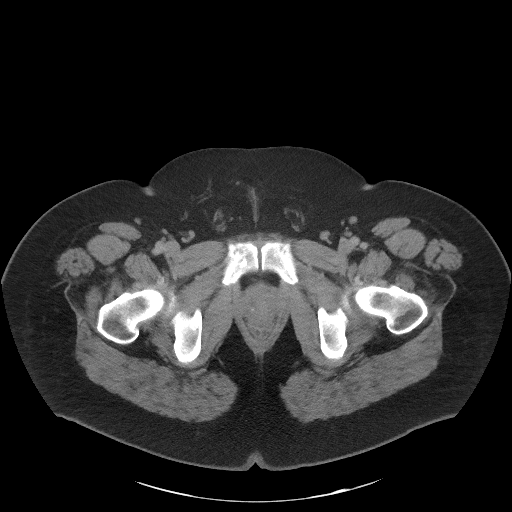
[im 25/114  soft-tissue]
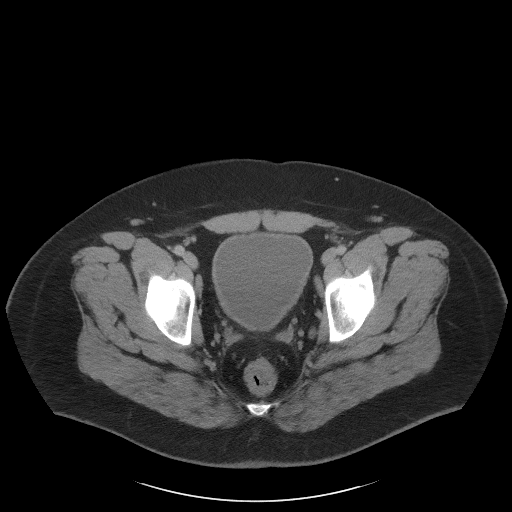
[im 30/114  soft-tissue]
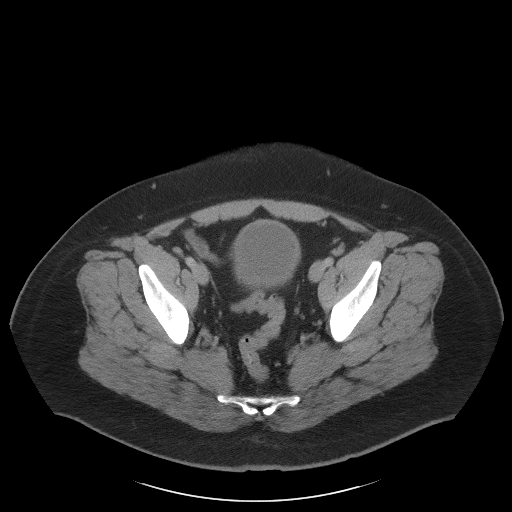
[im 40/114  soft-tissue]
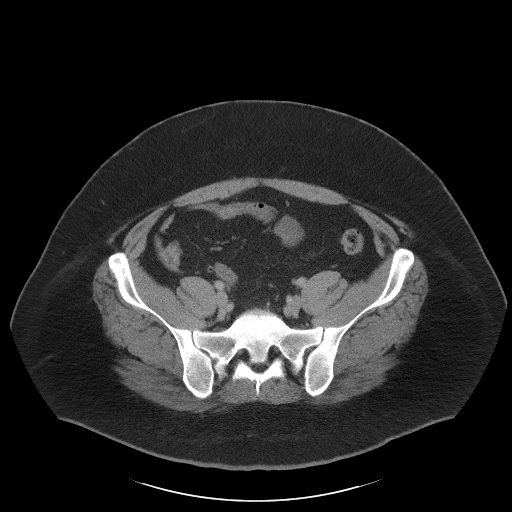
[im 50/114  soft-tissue]
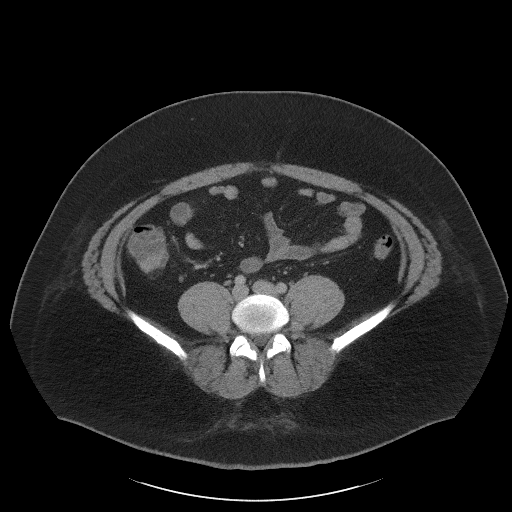
[im 59/114  soft-tissue]
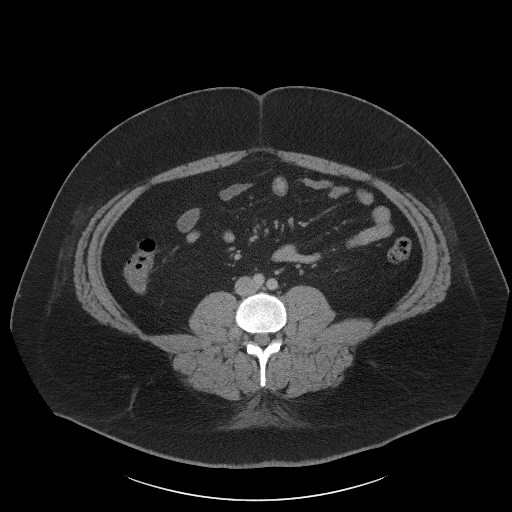
[im 64/114  soft-tissue]
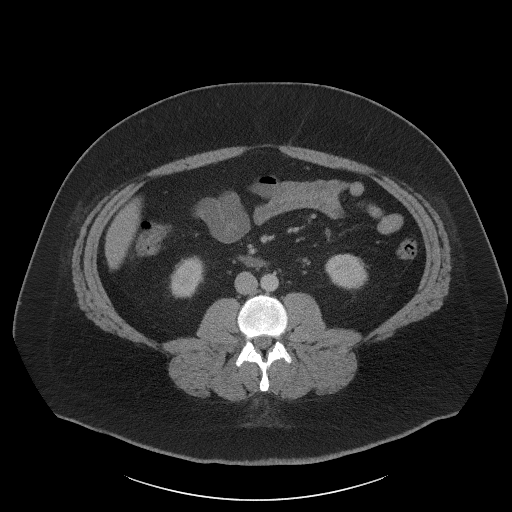
[im 74/114  soft-tissue]
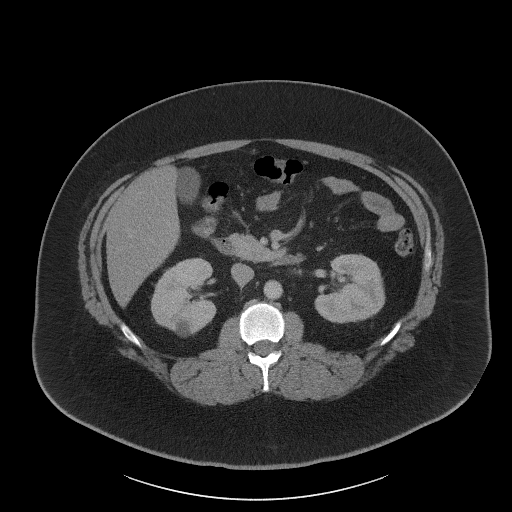
[im 74/114  bone]
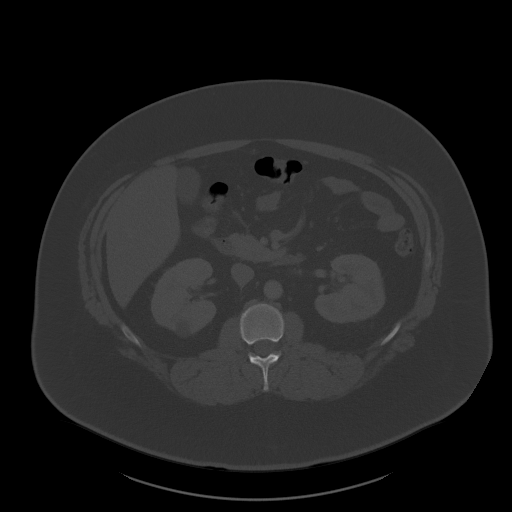
[im 84/114  soft-tissue]
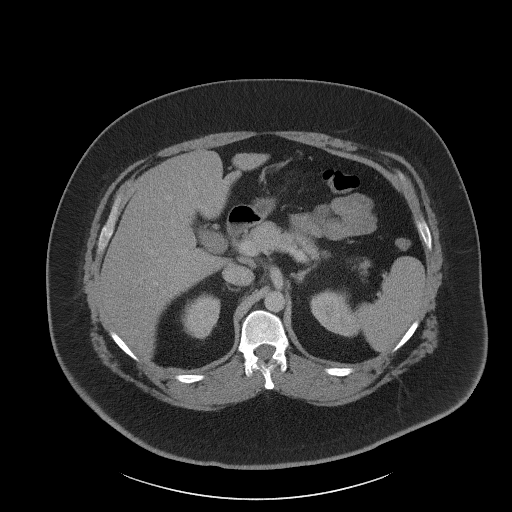
[im 89/114  soft-tissue]
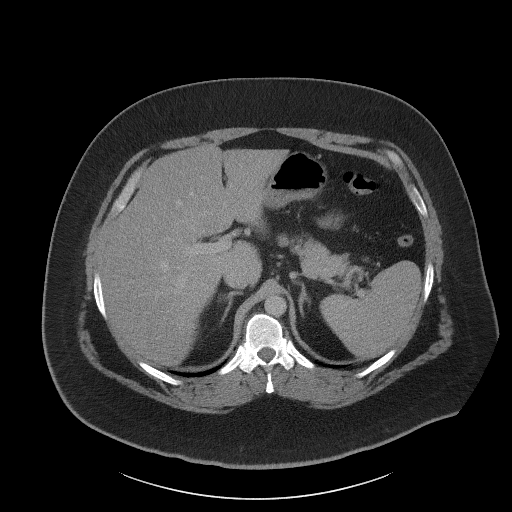
[im 99/114  soft-tissue]
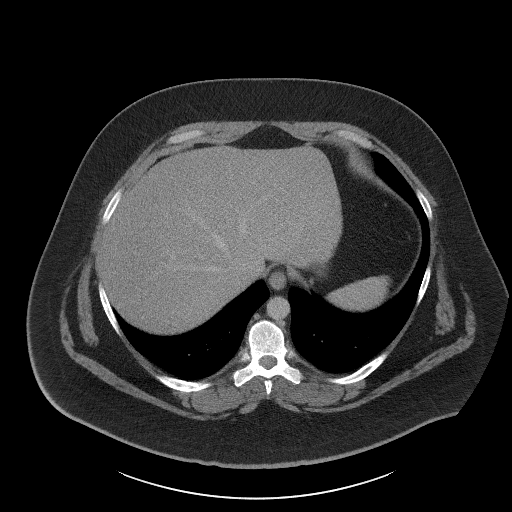
[im 109/114  soft-tissue]
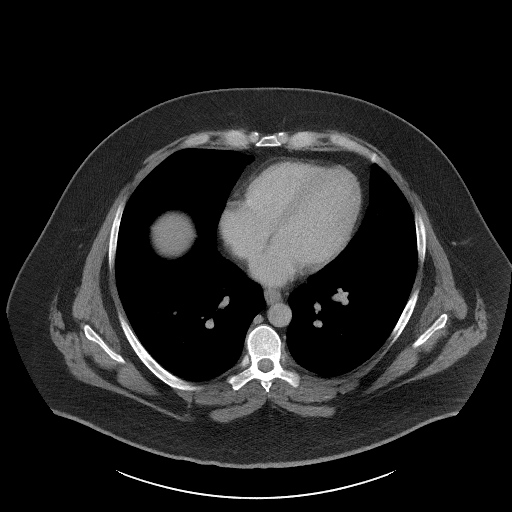

[Series 5: coronal st · coronal · 0.87mm/px · 3 of 126 slices shown]
[im 42/126  soft-tissue]
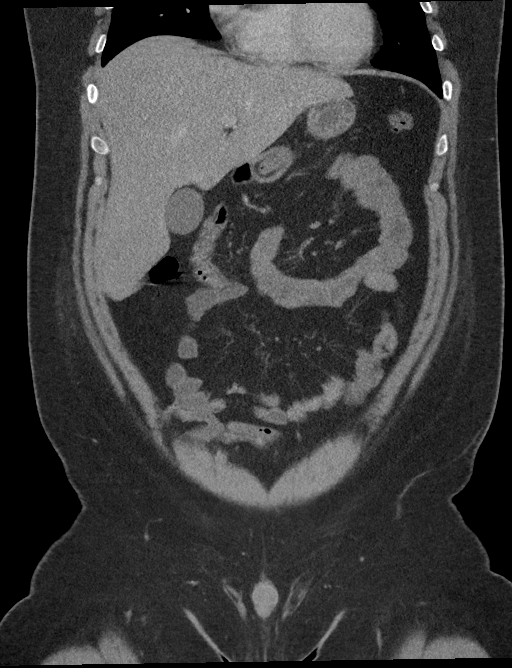
[im 56/126  soft-tissue]
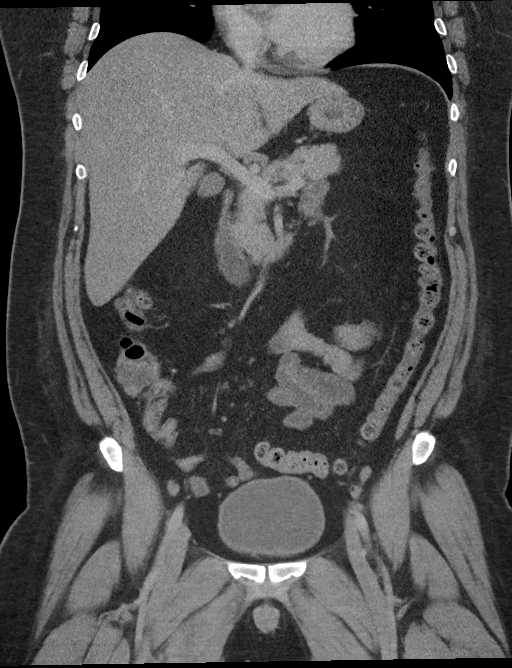
[im 70/126  soft-tissue]
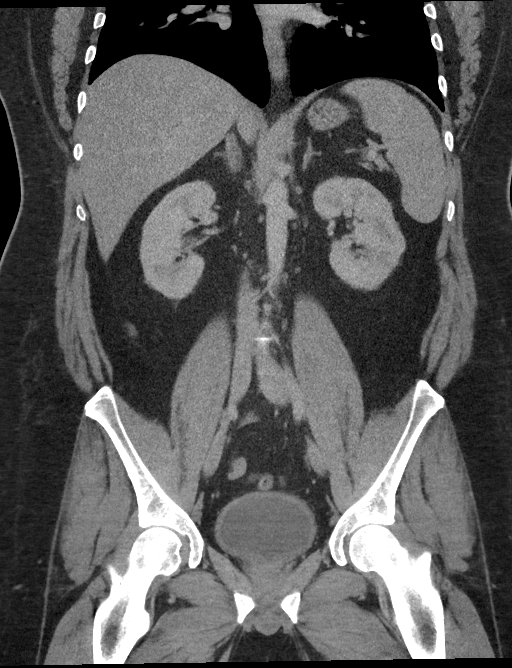

[16 of 46 positions shown; findings below may reference images not displayed]

FINDINGS: Lower chest: Normal heart size. Lung bases are clear.

Hepatobiliary: Liver is normal in size and contour. Gallbladder is
unremarkable. No intrahepatic or extrahepatic biliary ductal
dilatation.

Pancreas: Unremarkable

Spleen: Unremarkable

Adrenals/Urinary Tract: Normal adrenal glands. Kidneys enhance
symmetrically with contrast. There is a 2.2 cm cyst within the
midpole of the right kidney. Urinary bladder is unremarkable.

Stomach/Bowel: No abnormal bowel wall thickening or evidence for
bowel obstruction. No free fluid or free intraperitoneal air. Normal
morphology of the stomach. Normal appendix.

Vascular/Lymphatic: Normal caliber abdominal aorta. No
retroperitoneal lymphadenopathy.

Reproductive: Heterogeneous prostate.

Other: None.

Musculoskeletal: No aggressive or acute appearing osseous lesions.
IMPRESSION: No acute process within the abdomen or pelvis.

## 2019-05-28 MED ORDER — HALOPERIDOL LACTATE 5 MG/ML IJ SOLN
5.0000 mg | Freq: Once | INTRAMUSCULAR | Status: AC
  Administered 2019-05-28: 5 mg via INTRAVENOUS
  Filled 2019-05-28: qty 1

## 2019-05-28 MED ORDER — SODIUM CHLORIDE 0.9 % IV BOLUS
1000.0000 mL | Freq: Once | INTRAVENOUS | Status: AC
  Administered 2019-05-28: 1000 mL via INTRAVENOUS

## 2019-05-28 MED ORDER — IOHEXOL 350 MG/ML SOLN
100.0000 mL | Freq: Once | INTRAVENOUS | Status: AC | PRN
  Administered 2019-05-28: 100 mL via INTRAVENOUS

## 2019-05-28 MED ORDER — ONDANSETRON HCL 4 MG/2ML IJ SOLN
4.0000 mg | Freq: Once | INTRAMUSCULAR | Status: AC
  Administered 2019-05-28: 4 mg via INTRAVENOUS
  Filled 2019-05-28: qty 2

## 2019-05-28 NOTE — Discharge Instructions (Signed)
Thank you for letting us take care of you in the emergency department today.   Please continue to take any regular, prescribed medications. Avoid marijuana use as this can exacerbate your symptoms.   Please follow up with: - Your primary care doctor to review your ER visit and follow up on your symptoms.   Please return to the ER for any new or worsening symptoms.

## 2019-05-28 NOTE — ED Triage Notes (Signed)
Pt presents via POV c/o emesis x3-4 days. Denies abd pain.

## 2019-05-28 NOTE — ED Notes (Signed)
Pt given water for PO challenge 

## 2019-05-28 NOTE — ED Provider Notes (Signed)
[facility] Emergency Department Provider Note  ____________________________________________   First MD Initiated Contact with Patient 05/28/19 1802     (approximate)  I have reviewed the triage vital signs and the nursing notes.  History  Chief Complaint Emesis    HPI Diana Meyers is a 28 y.o. female to female with a history of PTSD, depression, ADHD, hypertension, hyperlipidemia who presents the emergency department for nausea and vomiting.  Patient states he has had multiple episodes of nausea and vomiting over the last 3 to 4 days.  Nonbloody, nonbilious.  He denies any associated abdominal pain.  No fevers, chills, respiratory symptoms.  No diarrhea.  No urinary symptoms.  No exposure to sick contacts.  No recent antibiotics or travel. Denies alcohol use. Does report marijuana use, states last use was last week.         Past Medical Hx Past Medical History:  Diagnosis Date  . Anxiety   . Bipolar 1 disorder (HCC)   . Depression   . Disassociation disorder   . Psychogenic nonepileptic seizure   . PTSD (post-traumatic stress disorder)     Problem List Patient Active Problem List   Diagnosis Date Noted  . Drug-induced insomnia (HCC) 02/22/2018  . PTSD (post-traumatic stress disorder) 02/22/2018  . Anxiety 02/22/2018  . Gender dysphoria 03/14/2016  . Psychogenic nonepileptic seizure 02/18/2016  . Eating disorder 02/07/2016  . Severe episode of recurrent major depressive disorder, without psychotic features (HCC) 02/07/2016    Past Surgical Hx Past Surgical History:  Procedure Laterality Date  . DENTAL SURGERY      Medications Prior to Admission medications   Medication Sig Start Date End Date Taking? Authorizing Provider  atorvastatin (LIPITOR) 10 MG tablet Take 1 tablet (10 mg total) by mouth daily. 05/26/19   Duanne LimerickJones, Deanna C, MD  DULoxetine (CYMBALTA) 60 MG capsule Take 60 mg by mouth daily. psych 03/29/19   [provider]   estradiol (ESTRACE) 2 MG tablet Take 1 tablet (2 mg total) by mouth 3 (three) times daily. 04/21/19   Duanne LimerickJones, Deanna C, MD  lisdexamfetamine (VYVANSE) 50 MG capsule Take 1 capsule (50 mg total) by mouth daily. Patient not taking: Reported on 10/25/2018 02/22/18   Duanne LimerickJones, Deanna C, MD  metFORMIN (GLUCOPHAGE) 500 MG tablet Take 1 tablet (500 mg total) by mouth daily with breakfast. 05/26/19   Duanne LimerickJones, Deanna C, MD  spironolactone (ALDACTONE) 50 MG tablet Take 2 tablets (100 mg total) by mouth 2 (two) times daily. 04/21/19   Duanne LimerickJones, Deanna C, MD  traZODone (DESYREL) 100 MG tablet Take 1 tablet (100 mg total) by mouth at bedtime. Patient taking differently: Take 200 mg by mouth at bedtime. psych 02/22/18   Duanne LimerickJones, Deanna C, MD  zolpidem (AMBIEN) 10 MG tablet Take 10 mg by mouth at bedtime. psych 09/27/18   [provider]    Allergies Shellfish allergy  Family Hx History reviewed. No pertinent family history.  Social Hx Social History   Tobacco Use  . Smoking status: Never Smoker  . Smokeless tobacco: Never Used  Substance Use Topics  . Alcohol use: Not Currently  . Drug use: Yes    Types: Marijuana     Review of Systems  Constitutional: Negative for fever. Negative for chills. Eyes: Negative for visual changes. ENT: Negative for sore throat. Cardiovascular: Negative for chest pain. Respiratory: Negative for shortness of breath. Gastrointestinal: Negative for abdominal pain. + nausea, vomiting Genitourinary: Negative for dysuria. Musculoskeletal: Negative for leg swelling. Skin: Negative  for rash. Neurological: Negative for for headaches.   Physical Exam  Vital Signs: ED Triage Vitals  Enc Vitals Group     BP 05/28/19 1342 106/73     Pulse Rate 05/28/19 1342 (!) 104     Resp 05/28/19 1342 14     Temp 05/28/19 1342 98.9 F (37.2 C)     Temp Source 05/28/19 1752 Oral     SpO2 05/28/19 1342 97 %     Weight 05/28/19 1344 (!) 380 lb (172.4 kg)     Height 05/28/19 1344 6\' 3"   (1.905 m)     Head Circumference --      Peak Flow --      Pain Score 05/28/19 1343 0     Pain Loc --      Pain Edu? --      Excl. in [place]? --     Constitutional: Alert and oriented.  Eyes: Conjunctivae clear. Sclera anicteric. Head: Normocephalic. Atraumatic. Nose: No congestion. No rhinorrhea. Mouth/Throat: Mucous membranes are moist.  Neck: No stridor.   Cardiovascular: Normal rate, regular rhythm. No murmurs. Extremities well perfused. Respiratory: Normal respiratory effort.  Lungs CTAB. Gastrointestinal: Soft. Mild periumbilical soreness to palpation. No rebound or guarding. Remainder of abdomen is NT. Musculoskeletal: No lower extremity edema. Neurologic:  Normal speech and language. No gross focal neurologic deficits are appreciated.  Skin: Skin is warm, dry and intact. No rash noted. Psychiatric: Mood and affect are appropriate for situation.  EKG  N/A    Radiology  CT: IMPRESSION: No acute process within the abdomen or pelvis.     Procedures  Procedure(s) performed (including critical care):  Procedures   Initial Impression / Assessment and Plan / ED Course  28 y.o. female who presents to the ED for nausea and vomiting, as above.  Ddx: gastroenteritis, viral process, pancreatitis, appendicitis, cannabis hyperemesis  Plan: labs initiated in triage reveal normal lipase and LFT. No UTI. Leukocytosis to 20. May be reactive, but given mild umbilical tenderness, will obtain CT to r/o appendicitis. Fluids, symptom control, reassess.  CT negative. Updated patient. Still reports some nausea, dry heaves after Zofran. UDS + for cannabis. Suspect cannabis hyperemesis. Will given dose of Haldol and then PO challenge, if passes, anticipate discharge. Given return precautions.     Final Clinical Impression(s) / ED Diagnosis  Final diagnoses:  Nausea and vomiting in adult       Note:  This document was prepared using Dragon voice recognition software and may  include unintentional dictation errors.   Lilia Pro., MD 05/28/19 [phone]

## 2019-05-28 NOTE — ED Notes (Signed)
Patient transported to CT 

## 2019-05-28 NOTE — ED Notes (Signed)
Pt has psychogenic seizures and is aware of this. Pt had an episode lasting about 30 seconds witnessed by this RN. Pt aware of seizure after and AOx4 after incident. Pt did not have any convulsing. Pt states "this happens all the time".

## 2019-05-28 NOTE — ED Notes (Signed)
Pt drinking water and eating crackers without trouble at this time.

## 2019-05-28 NOTE — ED Notes (Signed)
Pt's roommate coming to get patient.

## 2019-06-14 ENCOUNTER — Emergency Department
Admission: EM | Admit: 2019-06-14 | Discharge: 2019-06-14 | Disposition: A | Discharge status: Home / Self Care | Payer: Medicare Other | Attending: Emergency Medicine | Admitting: Emergency Medicine

## 2019-06-14 ENCOUNTER — Other Ambulatory Visit: Payer: Self-pay

## 2019-06-14 ENCOUNTER — Encounter: Payer: Self-pay | Admitting: Intensive Care

## 2019-06-14 DIAGNOSIS — Z7984 Long term (current) use of oral hypoglycemic drugs: Secondary | ICD-10-CM | POA: Diagnosis not present

## 2019-06-14 DIAGNOSIS — Z79899 Other long term (current) drug therapy: Secondary | ICD-10-CM | POA: Insufficient documentation

## 2019-06-14 DIAGNOSIS — Z760 Encounter for issue of repeat prescription: Secondary | ICD-10-CM | POA: Insufficient documentation

## 2019-06-14 DIAGNOSIS — F1210 Cannabis abuse, uncomplicated: Secondary | ICD-10-CM | POA: Diagnosis not present

## 2019-06-14 MED ORDER — QUETIAPINE FUMARATE 100 MG PO TABS: 100.0000 mg | ORAL_TABLET | Freq: Every day | ORAL | 0 refills | Status: AC

## 2019-06-14 MED ORDER — LISDEXAMFETAMINE DIMESYLATE 50 MG PO CAPS: 50.0000 mg | ORAL_CAPSULE | Freq: Every day | ORAL | 0 refills | Status: AC

## 2019-06-14 NOTE — ED Triage Notes (Signed)
Patient reports she is here for medication refill.

## 2019-06-14 NOTE — ED Provider Notes (Signed)
[facility] Emergency Department Provider Note ____________________________________________  Time seen: 1155  I have reviewed the triage vital signs and the nursing notes.  HISTORY  Chief Complaint  Medication Refill  HPI Diana Meyers is a 28 y.o.transgender female requests refills of Vyvanse and Seroquel.   She is apparently in the care of Dr. Launa Flight, as well as [facility] for medication and psych counseling.  She gives history that the 2 providers have been unable to agree on who she right the patient's behavioral medicines.  She takes Seroquel for insomnia, and takes the Vyvanse for her eating disorder.  She presents today, reporting an appointment in approximately 3 weeks with [facility], and requesting refills of her medicines.  She denies any other complaints at this time, but is expressing her frustration with the inability of her providers to work together to provide her the best and most comprehensive care.  Past Medical History:  Diagnosis Date  . Anxiety   . Bipolar 1 disorder ([place])   . Depression   . Disassociation disorder   . Psychogenic nonepileptic seizure   . PTSD (post-traumatic stress disorder)     Patient Active Problem List   Diagnosis Date Noted  . Drug-induced insomnia ([place]) 02/22/2018  . PTSD (post-traumatic stress disorder) 02/22/2018  . Anxiety 02/22/2018  . Gender dysphoria 03/14/2016  . Psychogenic nonepileptic seizure 02/18/2016  . Eating disorder 02/07/2016  . Severe episode of recurrent major depressive disorder, without psychotic features ([place]) 02/07/2016    Past Surgical History:  Procedure Laterality Date  . DENTAL SURGERY      Prior to Admission medications   Medication Sig Start Date End Date Taking? Authorizing Provider  atorvastatin (LIPITOR) 10 MG tablet Take 1 tablet (10 mg total) by mouth daily. 05/26/19   Juline Patch, MD  DULoxetine (CYMBALTA) 60 MG capsule Take 60 mg by mouth daily. psych 03/29/19   [provider]  estradiol (ESTRACE) 2 MG tablet Take 1 tablet (2 mg total) by mouth 3 (three) times daily. 04/21/19   Juline Patch, MD  lisdexamfetamine (VYVANSE) 50 MG capsule Take 1 capsule (50 mg total) by mouth daily. 06/14/19 07/14/19  December Hedtke, Dannielle Karvonen, PA-C  metFORMIN (GLUCOPHAGE) 500 MG tablet Take 1 tablet (500 mg total) by mouth daily with breakfast. 05/26/19   Juline Patch, MD  QUEtiapine (SEROQUEL) 100 MG tablet Take 1 tablet (100 mg total) by mouth at bedtime. 06/14/19 07/14/19  Mozelle Remlinger, Dannielle Karvonen, PA-C  spironolactone (ALDACTONE) 50 MG tablet Take 2 tablets (100 mg total) by mouth 2 (two) times daily. 04/21/19   Juline Patch, MD  traZODone (DESYREL) 100 MG tablet Take 1 tablet (100 mg total) by mouth at bedtime. Patient taking differently: Take 200 mg by mouth at bedtime. psych 02/22/18   Juline Patch, MD  zolpidem (AMBIEN) 10 MG tablet Take 10 mg by mouth at bedtime. psych 09/27/18   [provider]    Allergies Shellfish allergy  History reviewed. No pertinent family history.  Social History Social History   Tobacco Use  . Smoking status: Never Smoker  . Smokeless tobacco: Never Used  Substance Use Topics  . Alcohol use: Yes    Comment: occ  . Drug use: Not Currently    Types: Marijuana    Review of Systems  Constitutional: Negative for fever. Eyes: Negative for visual changes. ENT: Negative for sore throat. Cardiovascular: Negative for chest pain. Respiratory: Negative for shortness of breath. Gastrointestinal: Negative for abdominal  pain, vomiting and diarrhea. Genitourinary: Negative for dysuria. Musculoskeletal: Negative for back pain. Skin: Negative for rash. Neurological: Negative for headaches, focal weakness or numbness. ____________________________________________  PHYSICAL EXAM:  VITAL SIGNS: ED Triage Vitals  Enc Vitals Group     BP 06/14/19 1137 116/87     Pulse Rate 06/14/19 1137 91     Resp 06/14/19 1137 16     Temp 06/14/19  1137 98.3 F (36.8 C)     Temp Source 06/14/19 1137 Oral     SpO2 06/14/19 1137 96 %     Weight 06/14/19 1135 (!) 356 lb (161.5 kg)     Height 06/14/19 1135 6\' 3"  (1.905 m)     Head Circumference --      Peak Flow --      Pain Score 06/14/19 1135 0     Pain Loc --      Pain Edu? --      Excl. in GC? --     Constitutional: Alert and oriented. Well appearing and in no distress. Head: Normocephalic and atraumatic. Eyes: Conjunctivae are normal. Normal extraocular movements Cardiovascular: Normal rate, regular rhythm. Normal distal pulses. Respiratory: Normal respiratory effort. No wheezes/rales/rhonchi. Gastrointestinal: Soft and nontender. No distention. Musculoskeletal: Nontender with normal range of motion in all extremities.  Neurologic:  Normal gait without ataxia. Normal speech and language. No gross focal neurologic deficits are appreciated. Skin:  Skin is warm, dry and intact. No rash noted. Psychiatric: Mood and affect are normal. Patient exhibits appropriate insight and judgment.  ____________________________________________  PROCEDURES  Procedures ____________________________________________  INITIAL IMPRESSION / ASSESSMENT AND PLAN / ED COURSE  Patient with with the ED request for refill of her behavior medicines.  Patient chart review does reveal multiple encounters and chart notes referring to the need for these 2 medications.  I did confirm with our treated the patient has an appointment scheduled for 07/04/2019 medication management.  I spoke to the receptionist who did confirm the appointment, and was agreeable to placing a note in the chart to notified the provider a courtesy prescription would be about here in the ED.  Patient is discharged with instructions and understanding that she must keep the appointment as scheduled for medication management.  She is relating to me that she intends to relocate to her home state where she apparently got better continuity of  care.  Patient is notified that she was given a courtesy prescription and may not be afforded the same courtesy if she returns to the ED for request of the same medicines in the future.  Diana Meyers was evaluated in Emergency Department on 06/14/2019 for the symptoms described in the history of present illness. She was evaluated in the context of the global COVID-19 pandemic, which necessitated consideration that the patient might be at risk for infection with the SARS-CoV-2 virus that causes COVID-19. Institutional protocols and algorithms that pertain to the evaluation of patients at risk for COVID-19 are in a state of rapid change based on information released by regulatory bodies including the CDC and federal and state organizations. These policies and algorithms were followed during the patient's care in the ED.  I reviewed the patient's prescription history over the last 12 months in the multi-state controlled substances database(s) that includes [place], [place], [place], [place], [place], [place], [place], [place], [place], [place] [employer] [place], [place], [place], and [place].  Results were notable for monthly Zolpidem RXs. ____________________________________________  FINAL CLINICAL IMPRESSION(S) / ED DIAGNOSES  Final diagnoses:  Encounter for medication refill      Lissa Hoard, PA-C 06/14/19 1930    Sharyn Creamer, MD 06/15/19 2358

## 2019-06-14 NOTE — ED Notes (Signed)
Se e triage note  Presents for medication refill  States he has a new Psych MD  And has an appt on Oct 16th  Requesting Vyvanse 50 mg  And Seroquel 100 mg

## 2019-06-14 NOTE — Discharge Instructions (Addendum)
Diana Meyers, you have been given a courtesy refill of your maintenance medicines from the ED. Please know that you may not receive similar refills in the future. You must keep your scheduled appointments to maintain medicine as prescribed. Follow-up as scheduled and return as needed.

## 2019-06-15 ENCOUNTER — Telehealth: Payer: Self-pay

## 2019-06-15 NOTE — Telephone Encounter (Signed)
Pt called in to explain that her "frustration is with [facility] not with you." She stated she had "been without sleep for several days and was a little more angry than I should have been at the ER." She also stated she "understand that Dr Ronnald Ramp can't take care of my psych meds because she is not that kind of doctor, but [facility] keeps blowing me off that's why I had to go to the ER." I told pt thank you for calling and we will see her at the next visit.

## 2019-06-15 NOTE — Telephone Encounter (Signed)
I called pt and left a message stating that Dr Ronnald Ramp received a note from her ER visit yesterday. She only takes care of her hormone medication/ Spironolactone, metformin for diabetes, atorvastatin for cholesterol and occasionally trazodone for sleep. We don't prescribe the psychotic meds for her. However, we have made multiple calls to [facility] at [facility] office and Dr Sabino Gasser to get some help for her. I also apologized if she doesn't feel as if we have tried to assist her. Will await call back

## 2019-07-04 DIAGNOSIS — F332 Major depressive disorder, recurrent severe without psychotic features: Secondary | ICD-10-CM | POA: Diagnosis not present

## 2019-07-15 ENCOUNTER — Other Ambulatory Visit: Payer: Self-pay

## 2019-07-15 ENCOUNTER — Other Ambulatory Visit: Payer: Medicare Other

## 2019-07-15 DIAGNOSIS — R69 Illness, unspecified: Secondary | ICD-10-CM

## 2019-07-15 DIAGNOSIS — E782 Mixed hyperlipidemia: Secondary | ICD-10-CM

## 2019-07-15 DIAGNOSIS — E119 Type 2 diabetes mellitus without complications: Secondary | ICD-10-CM | POA: Diagnosis not present

## 2019-07-16 LAB — LIPID PANEL WITH LDL/HDL RATIO
Cholesterol, Total: 165 mg/dL (ref 100–199)
HDL: 30 mg/dL — ABNORMAL LOW (ref >39)
LDL Chol Calc (NIH): 100 mg/dL — ABNORMAL HIGH (ref 0–99)
LDL/HDL Ratio: 3.3 ratio — ABNORMAL HIGH (ref 0.0–3.2)
Triglycerides: 204 mg/dL — ABNORMAL HIGH (ref 0–149)
VLDL Cholesterol Cal: 35 mg/dL (ref 5–40)

## 2019-07-16 LAB — HEPATIC FUNCTION PANEL
ALT: 53 IU/L — ABNORMAL HIGH (ref 0–32)
AST: 33 IU/L (ref 0–40)
Albumin: 4.5 g/dL (ref 3.9–5.0)
Alkaline Phosphatase: 137 IU/L — ABNORMAL HIGH (ref 39–117)
Bilirubin Total: 0.4 mg/dL (ref 0.0–1.2)
Bilirubin, Direct: 0.17 mg/dL (ref 0.00–0.40)
Total Protein: 7.5 g/dL (ref 6.0–8.5)

## 2019-07-16 LAB — HEMOGLOBIN A1C
Est. average glucose Bld gHb Est-mCnc: 123 mg/dL
Hgb A1c MFr Bld: 5.9 % — ABNORMAL HIGH (ref 4.8–5.6)

## 2019-07-28 ENCOUNTER — Other Ambulatory Visit: Payer: Self-pay

## 2019-07-28 DIAGNOSIS — E119 Type 2 diabetes mellitus without complications: Secondary | ICD-10-CM

## 2019-07-28 DIAGNOSIS — E782 Mixed hyperlipidemia: Secondary | ICD-10-CM

## 2019-07-28 MED ORDER — ATORVASTATIN CALCIUM 10 MG PO TABS: 10.0000 mg | ORAL_TABLET | Freq: Every day | ORAL | 1 refills | Status: AC

## 2019-07-28 MED ORDER — METFORMIN HCL 500 MG PO TABS: 500.0000 mg | ORAL_TABLET | Freq: Every day | ORAL | 1 refills | Status: AC

## 2019-10-20 ENCOUNTER — Ambulatory Visit: Payer: Medicare Other | Admitting: Family Medicine

## 2020-02-03 ENCOUNTER — Telehealth: Payer: Self-pay | Admitting: Family Medicine

## 2020-02-03 NOTE — Telephone Encounter (Signed)
Left message for patient to call back and schedule Medicare Annual Wellness Visit (AWV) either virtually/audio only or in office. Whichever the patients preference is.  No history of AWV; please schedule at anytime with [facility]-Nurse Health Advisor.  This should be a 40 minute visit 

## 2020-02-06 DIAGNOSIS — F411 Generalized anxiety disorder: Secondary | ICD-10-CM | POA: Diagnosis not present

## 2020-02-06 DIAGNOSIS — F329 Major depressive disorder, single episode, unspecified: Secondary | ICD-10-CM | POA: Diagnosis not present

## 2020-02-22 DIAGNOSIS — F411 Generalized anxiety disorder: Secondary | ICD-10-CM | POA: Diagnosis not present

## 2020-02-22 DIAGNOSIS — F329 Major depressive disorder, single episode, unspecified: Secondary | ICD-10-CM | POA: Diagnosis not present

## 2020-03-15 DIAGNOSIS — Z7189 Other specified counseling: Secondary | ICD-10-CM | POA: Diagnosis not present

## 2020-03-15 DIAGNOSIS — Z136 Encounter for screening for cardiovascular disorders: Secondary | ICD-10-CM | POA: Diagnosis not present

## 2020-03-15 DIAGNOSIS — Z1389 Encounter for screening for other disorder: Secondary | ICD-10-CM | POA: Diagnosis not present

## 2020-03-15 DIAGNOSIS — Z0001 Encounter for general adult medical examination with abnormal findings: Secondary | ICD-10-CM | POA: Diagnosis not present

## 2020-03-15 DIAGNOSIS — Z0189 Encounter for other specified special examinations: Secondary | ICD-10-CM | POA: Diagnosis not present

## 2020-03-15 DIAGNOSIS — E119 Type 2 diabetes mellitus without complications: Secondary | ICD-10-CM | POA: Diagnosis not present

## 2020-03-15 DIAGNOSIS — F338 Other recurrent depressive disorders: Secondary | ICD-10-CM | POA: Diagnosis not present

## 2020-03-19 DIAGNOSIS — Z1389 Encounter for screening for other disorder: Secondary | ICD-10-CM | POA: Diagnosis not present

## 2020-03-19 DIAGNOSIS — E119 Type 2 diabetes mellitus without complications: Secondary | ICD-10-CM | POA: Diagnosis not present

## 2020-03-19 DIAGNOSIS — Z0001 Encounter for general adult medical examination with abnormal findings: Secondary | ICD-10-CM | POA: Diagnosis not present

## 2020-03-19 DIAGNOSIS — Z01812 Encounter for preprocedural laboratory examination: Secondary | ICD-10-CM | POA: Diagnosis not present

## 2020-03-19 DIAGNOSIS — Z0189 Encounter for other specified special examinations: Secondary | ICD-10-CM | POA: Diagnosis not present

## 2020-03-19 DIAGNOSIS — F338 Other recurrent depressive disorders: Secondary | ICD-10-CM | POA: Diagnosis not present

## 2020-03-19 DIAGNOSIS — Z136 Encounter for screening for cardiovascular disorders: Secondary | ICD-10-CM | POA: Diagnosis not present

## 2020-03-19 DIAGNOSIS — Z7189 Other specified counseling: Secondary | ICD-10-CM | POA: Diagnosis not present

## 2020-03-29 DIAGNOSIS — F411 Generalized anxiety disorder: Secondary | ICD-10-CM | POA: Diagnosis not present

## 2020-03-29 DIAGNOSIS — F329 Major depressive disorder, single episode, unspecified: Secondary | ICD-10-CM | POA: Diagnosis not present

## 2020-04-03 DIAGNOSIS — F338 Other recurrent depressive disorders: Secondary | ICD-10-CM | POA: Diagnosis not present

## 2020-04-03 DIAGNOSIS — Z79890 Hormone replacement therapy (postmenopausal): Secondary | ICD-10-CM | POA: Diagnosis not present

## 2020-04-03 DIAGNOSIS — Z713 Dietary counseling and surveillance: Secondary | ICD-10-CM | POA: Diagnosis not present

## 2020-04-03 DIAGNOSIS — Z712 Person consulting for explanation of examination or test findings: Secondary | ICD-10-CM | POA: Diagnosis not present

## 2020-04-03 DIAGNOSIS — E119 Type 2 diabetes mellitus without complications: Secondary | ICD-10-CM | POA: Diagnosis not present

## 2020-04-03 DIAGNOSIS — Z7189 Other specified counseling: Secondary | ICD-10-CM | POA: Diagnosis not present

## 2020-04-30 DIAGNOSIS — F411 Generalized anxiety disorder: Secondary | ICD-10-CM | POA: Diagnosis not present

## 2020-04-30 DIAGNOSIS — F329 Major depressive disorder, single episode, unspecified: Secondary | ICD-10-CM | POA: Diagnosis not present

## 2020-05-23 DIAGNOSIS — F329 Major depressive disorder, single episode, unspecified: Secondary | ICD-10-CM | POA: Diagnosis not present

## 2020-05-23 DIAGNOSIS — F411 Generalized anxiety disorder: Secondary | ICD-10-CM | POA: Diagnosis not present

## 2020-06-11 DIAGNOSIS — Z793 Long term (current) use of hormonal contraceptives: Secondary | ICD-10-CM | POA: Diagnosis not present

## 2020-06-11 DIAGNOSIS — I10 Essential (primary) hypertension: Secondary | ICD-10-CM | POA: Diagnosis not present

## 2020-06-11 DIAGNOSIS — E782 Mixed hyperlipidemia: Secondary | ICD-10-CM | POA: Diagnosis not present

## 2020-06-11 DIAGNOSIS — G479 Sleep disorder, unspecified: Secondary | ICD-10-CM | POA: Diagnosis not present

## 2020-06-11 DIAGNOSIS — E089 Diabetes mellitus due to underlying condition without complications: Secondary | ICD-10-CM | POA: Diagnosis not present

## 2020-06-11 DIAGNOSIS — F321 Major depressive disorder, single episode, moderate: Secondary | ICD-10-CM | POA: Diagnosis not present

## 2020-06-28 DIAGNOSIS — F329 Major depressive disorder, single episode, unspecified: Secondary | ICD-10-CM | POA: Diagnosis not present

## 2020-06-28 DIAGNOSIS — F411 Generalized anxiety disorder: Secondary | ICD-10-CM | POA: Diagnosis not present

## 2020-08-23 DIAGNOSIS — Z23 Encounter for immunization: Secondary | ICD-10-CM | POA: Diagnosis not present
